# Patient Record
Sex: Male | Born: 1955 | Race: White | Hispanic: No | Marital: Married | Smoking: Never smoker
Health system: Southern US, Community
[De-identification: ages and names within clinical notes are randomized; demographics above are authoritative.]

## PROBLEM LIST (undated history)

## (undated) DIAGNOSIS — C801 Malignant (primary) neoplasm, unspecified: Secondary | ICD-10-CM

## (undated) DIAGNOSIS — G935 Compression of brain: Secondary | ICD-10-CM

---

## 2000-10-28 ENCOUNTER — Ambulatory Visit (HOSPITAL_COMMUNITY): Admission: RE | Admit: 2000-10-28 | Discharge: 2000-10-29 | Payer: Self-pay | Admitting: Neurosurgery

## 2000-10-28 ENCOUNTER — Encounter: Payer: Self-pay | Admitting: Neurosurgery

## 2001-02-09 ENCOUNTER — Encounter: Payer: Self-pay | Admitting: Neurosurgery

## 2001-02-09 ENCOUNTER — Encounter: Admission: RE | Admit: 2001-02-09 | Discharge: 2001-02-09 | Payer: Self-pay | Admitting: Neurosurgery

## 2008-11-22 DIAGNOSIS — C801 Malignant (primary) neoplasm, unspecified: Secondary | ICD-10-CM

## 2008-11-22 HISTORY — DX: Malignant (primary) neoplasm, unspecified: C80.1

## 2009-06-27 ENCOUNTER — Encounter: Admission: RE | Admit: 2009-06-27 | Discharge: 2009-06-27 | Payer: Self-pay | Admitting: Family Medicine

## 2009-09-18 ENCOUNTER — Encounter: Admission: RE | Admit: 2009-09-18 | Discharge: 2009-09-18 | Payer: Self-pay | Admitting: Family Medicine

## 2009-10-03 ENCOUNTER — Ambulatory Visit (HOSPITAL_COMMUNITY): Admission: RE | Admit: 2009-10-03 | Discharge: 2009-10-03 | Payer: Self-pay | Admitting: Neurosurgery

## 2009-10-09 ENCOUNTER — Ambulatory Visit (HOSPITAL_COMMUNITY): Admission: RE | Admit: 2009-10-09 | Discharge: 2009-10-09 | Payer: Self-pay | Admitting: Neurosurgery

## 2009-10-13 ENCOUNTER — Ambulatory Visit: Payer: Self-pay | Admitting: Thoracic Surgery

## 2009-10-17 ENCOUNTER — Ambulatory Visit: Admission: RE | Admit: 2009-10-17 | Discharge: 2009-10-17 | Payer: Self-pay | Admitting: Thoracic Surgery

## 2009-10-20 ENCOUNTER — Ambulatory Visit (HOSPITAL_COMMUNITY): Admission: AD | Admit: 2009-10-20 | Discharge: 2009-10-21 | Payer: Self-pay | Admitting: Thoracic Surgery

## 2009-10-20 ENCOUNTER — Encounter (INDEPENDENT_AMBULATORY_CARE_PROVIDER_SITE_OTHER): Payer: Self-pay | Admitting: Diagnostic Radiology

## 2009-10-21 ENCOUNTER — Ambulatory Visit (HOSPITAL_COMMUNITY): Admission: RE | Admit: 2009-10-21 | Discharge: 2009-10-21 | Payer: Self-pay | Admitting: Radiology

## 2009-10-28 ENCOUNTER — Encounter: Admission: RE | Admit: 2009-10-28 | Discharge: 2009-10-28 | Payer: Self-pay | Admitting: Thoracic Surgery

## 2009-10-28 ENCOUNTER — Ambulatory Visit: Payer: Self-pay | Admitting: Thoracic Surgery

## 2010-05-13 IMAGING — CT CT HEAD WO/W CM
3 series · 18 of 30 positions shown, 20 images · IV contrast (agent unspecified)
Comparison: None

CLINICAL DATA: Left lung mass.  Staging.

CT HEAD WITHOUT AND WITH CONTRAST
TECHNIQUE: Contiguous axial images were obtained from the base of
the skull through the vertex without and with intravenous contrast.
Contrast: 75 ml [id number]

[Series 2: head w/o · axial · non-contrast · 0.49mm/px · z∈[+46,+175]mm · 8 of 32 slices shown, 10 images (1 of 2)]
[im 4/32  brain]
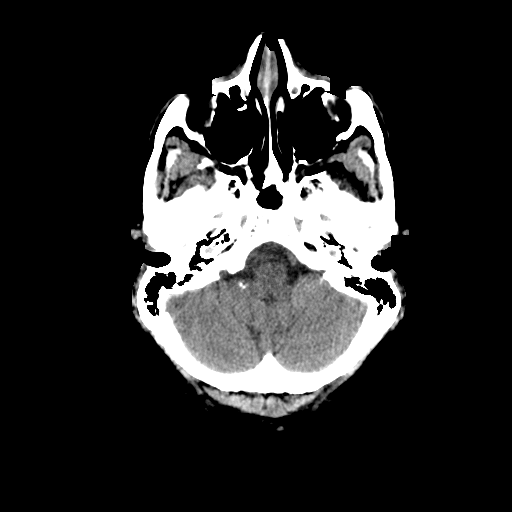
[im 4/32  bone]
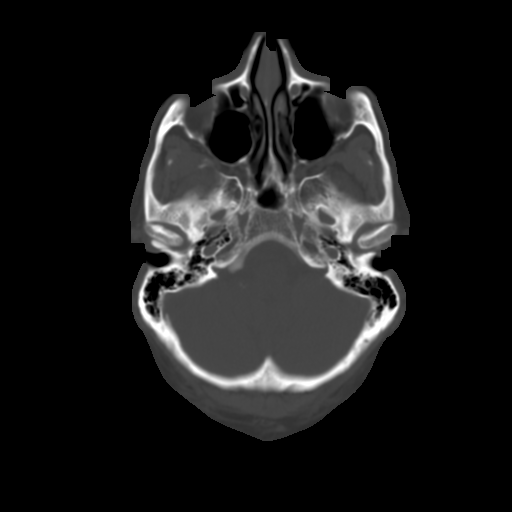
[im 7/32  brain]
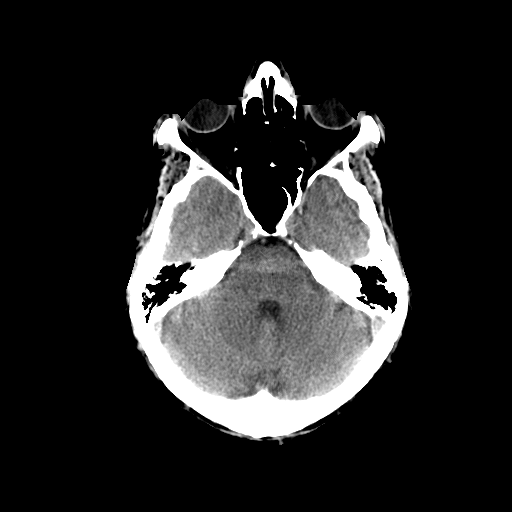
[im 11/32  brain]
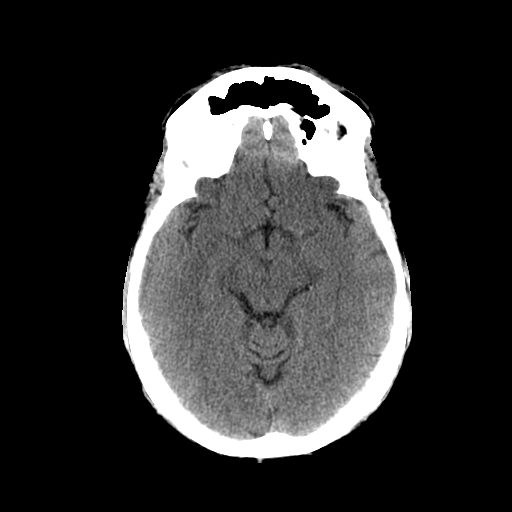
[im 14/32  brain]
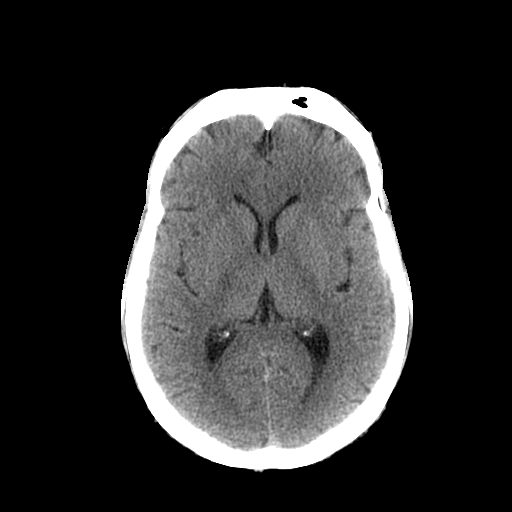
[im 18/32  brain]
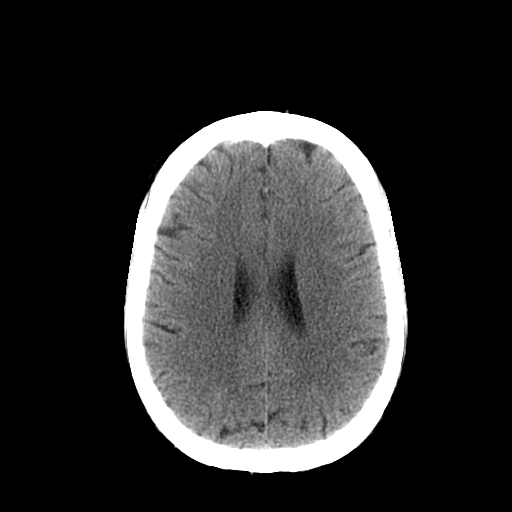
[im 18/32  bone]
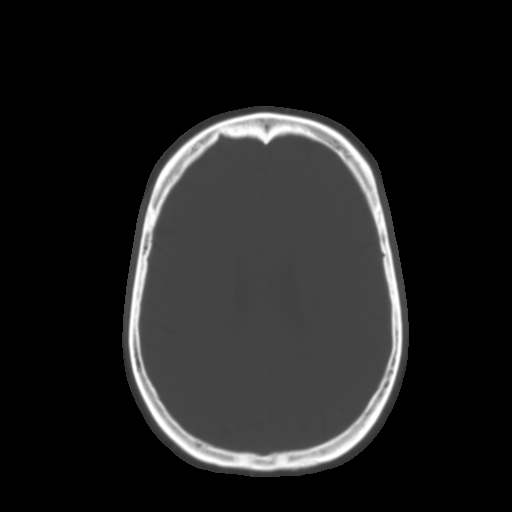
[im 21/32  brain]
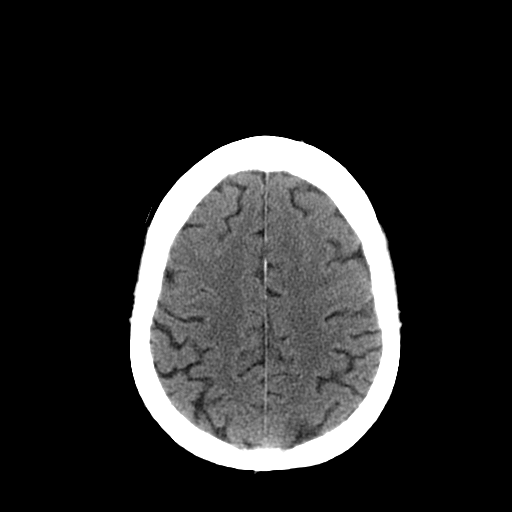
[im 25/32  brain]
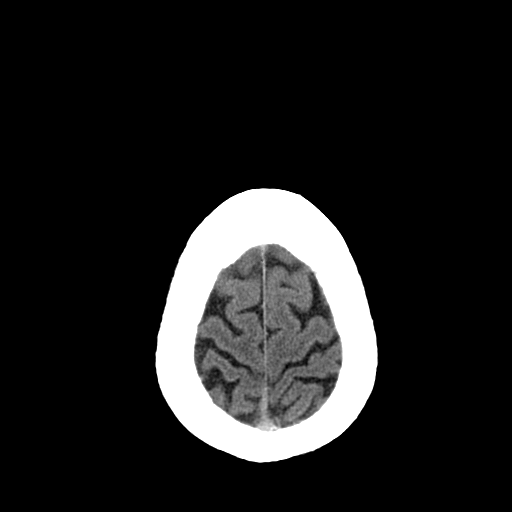
[im 28/32  brain]
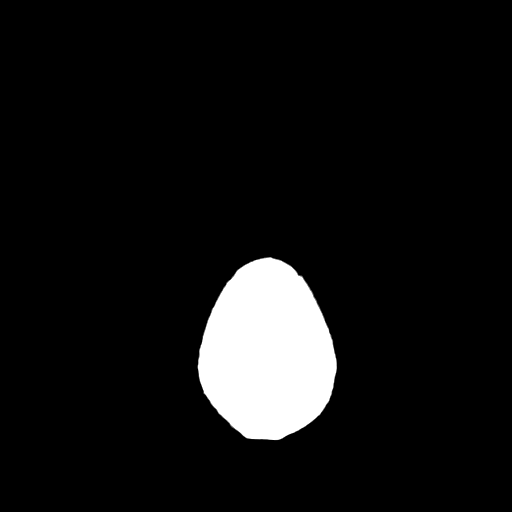

[Series 3: head bone · axial · 0.49mm/px · z∈[+46,+62]mm · 2 of 32 slices shown]
[im 4/32  bone]
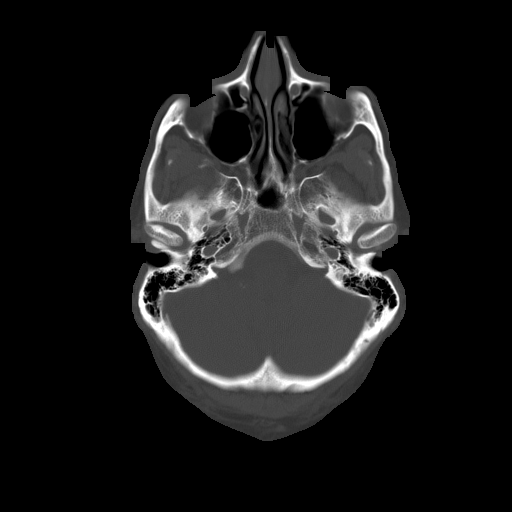
[im 7/32  bone]
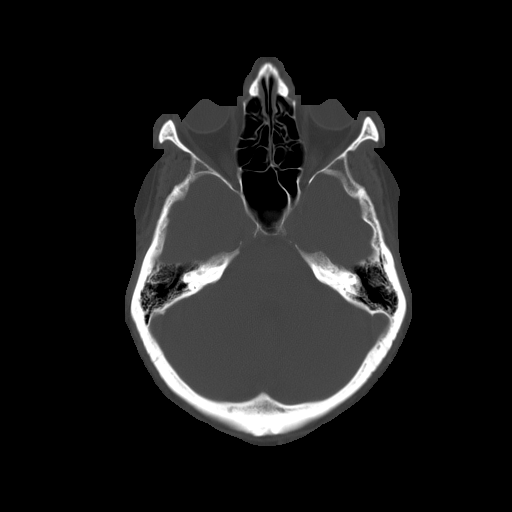

[Series 4: head w/o · axial · non-contrast · 0.49mm/px · z∈[+46,+175]mm · 8 of 32 slices shown (2 of 2)]
[im 4/32  brain]
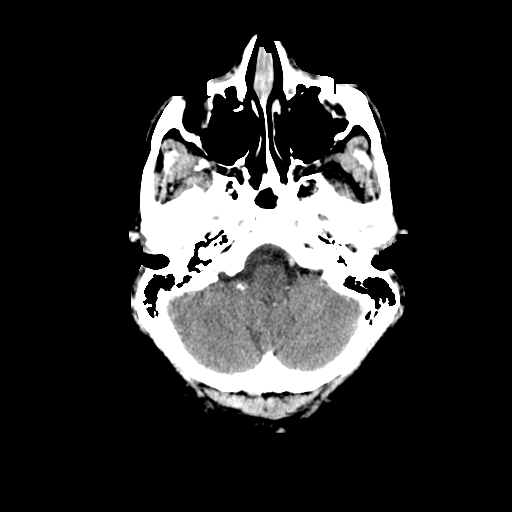
[im 7/32  brain]
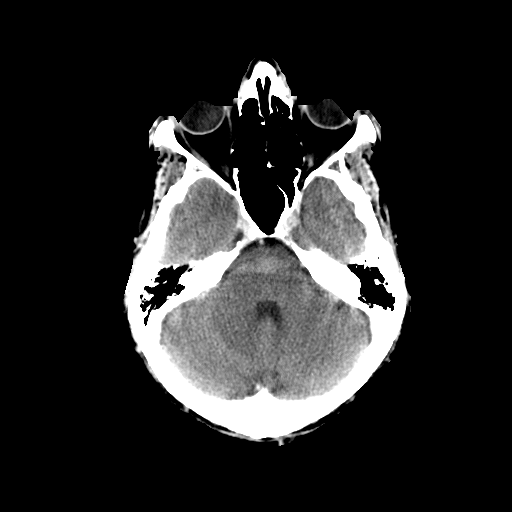
[im 11/32  brain]
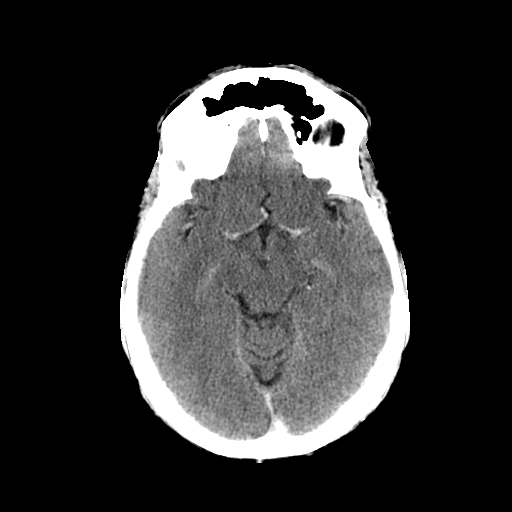
[im 14/32  brain]
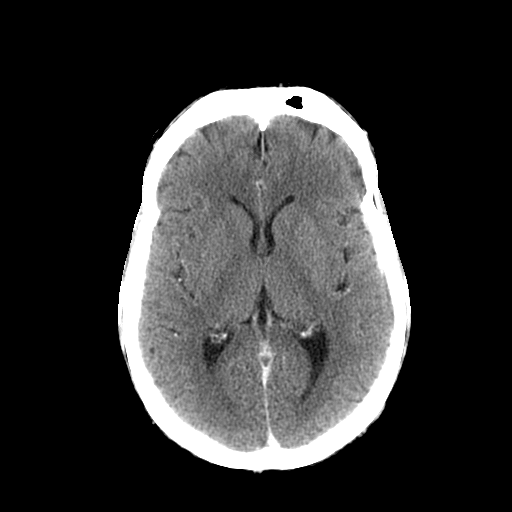
[im 18/32  brain]
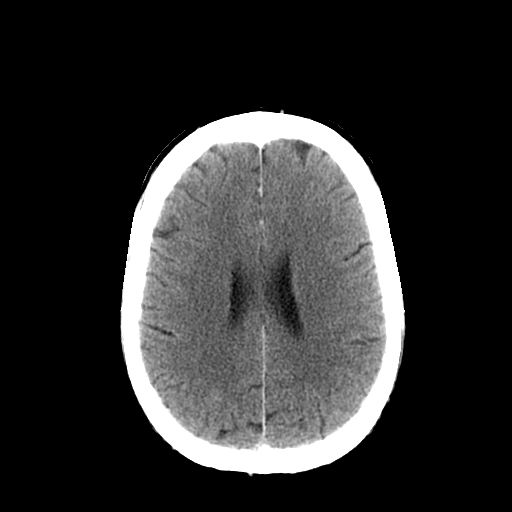
[im 21/32  brain]
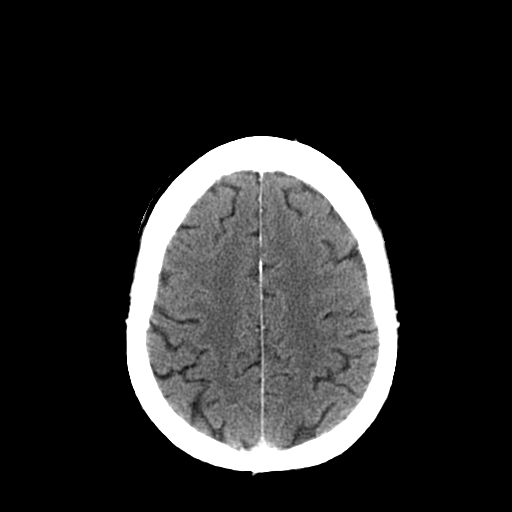
[im 25/32  brain]
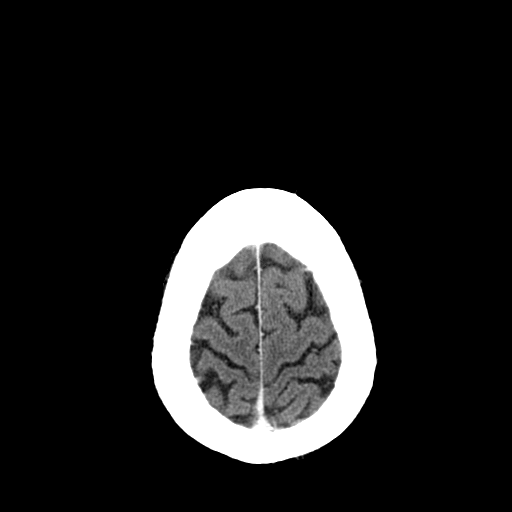
[im 28/32  brain]
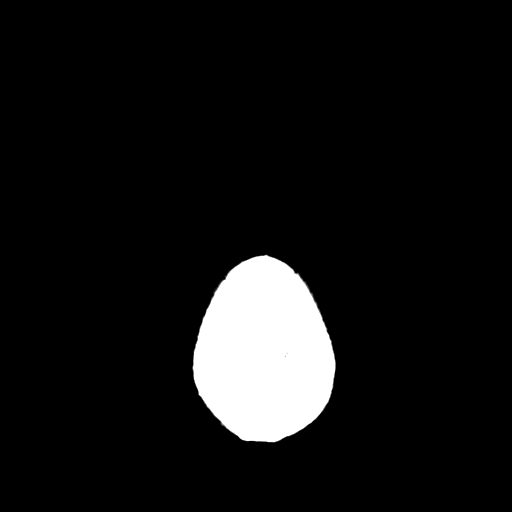

[18 of 30 positions shown; findings below may reference images not displayed]

FINDINGS: The brain has a normal appearance without evidence of
atrophy, old or acute infarction, mass lesion, hemorrhage,
hydrocephalus or extra-axial collection.  No abnormal enhancement
occurs.  The calvarium is unremarkable.  The sinuses, middle ears
and mastoids are clear.
IMPRESSION: Normal examination

## 2010-12-13 ENCOUNTER — Encounter: Payer: Self-pay | Admitting: Family Medicine

## 2011-02-24 LAB — APTT: aPTT: 32 seconds (ref 24–37)

## 2011-02-24 LAB — BODY FLUID CULTURE

## 2011-02-24 LAB — GLUCOSE, CAPILLARY: Glucose-Capillary: 125 mg/dL — ABNORMAL HIGH (ref 70–99)

## 2011-02-24 LAB — CBC
MCHC: 34.7 g/dL (ref 30.0–36.0)
RBC: 4.33 MIL/uL (ref 4.22–5.81)
RDW: 12.6 % (ref 11.5–15.5)

## 2011-02-24 LAB — PROTIME-INR
INR: 1 (ref 0.00–1.49)
Prothrombin Time: 13.1 seconds (ref 11.6–15.2)

## 2011-02-24 LAB — AFB CULTURE WITH SMEAR (NOT AT ARMC)

## 2011-02-24 LAB — FUNGUS CULTURE W SMEAR

## 2011-04-06 NOTE — Letter (Signed)
October 13, 2009   Stacie Acres. Cliffton Asters, MD  [address]  [place], [place] [address]   Re:  Norman Hernandez, NEGRON                  DOB:  1956/09/21   Dear Dr. Cliffton Asters:   I saw the patient today.  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  During the  MRI, they saw a left upper lobe apical lesion.  He gives a history of  having a stress test done at [facility] a year ago and he said he has had  chest x-rays in the past and does not remember here anything about any  left apical lesion.  The lesion is on CT scan is 40 mm x 30 mm.  A PET  scan was done that showed an uptake of 2.4, which was read as possibly a  lung cancer.  He has had no hemoptysis, fever, chills, or excessive  sputum.  No exposure to that he can tell of any recent exposure to TB.   PAST MEDICAL HISTORY:   MEDICATIONS:  He is on no medicines.   ALLERGIES:  He is allergic to penicillin.  He has been told he has  hypercholesterolemia.   FAMILY HISTORY:  Noncontributory.   SOCIAL HISTORY:  He is married, 2 children.  Works as an Copywriter, advertising.  Does not smoke, does not drink alcohol on a regular basis.   REVIEW OF SYSTEMS:  VITAL SIGNS:  He is 175 pounds.  He is 5 feet 10  inches.  GENERAL:  His weight has been stable.  CARDIAC:  No angina or atrial fibrillation.  PULMONARY:  No hemoptysis, productive cough, asthma, or wheezing.  GI:  No nausea, vomiting, constipation, or diarrhea.  GU:  No kidney disease, dysuria, or frequent urination.  VASCULAR:  No claudication, DVT, or TIAs.  NEUROLOGIC:  No dizziness, headaches, blackouts, or seizures.  MUSCULOSKELETAL:  No arthritis, joint pain, or rash.  PSYCHIATRIC:  No depression or nervousness.  EYE/ENT:  No changes in eyesight or hearing.  HEMATOLOGIC:  No problems with bleeding, clotting disorders, or anemia.   PHYSICAL EXAMINATION:   General:  He is a well-developed Caucasian male,  in no acute distress.  Vital Signs:  His blood pressure was 142/82,  pulse 72, sats were 100%, and respirations were 18.  Head, Eyes, Ears,  Nose, and Throat:  Unremarkable.  Neck:  Supple without thyromegaly.  There is no supraclavicular or axillary adenopathy.  Chest:  Clear to  auscultation and percussion.  Heart:  Regular sinus rhythm.  No murmurs.  Abdomen:  Soft.  There is no hepatosplenomegaly.  Extremities:  Pulses  are 2+.  There is no clubbing or edema.  Neurological:  He is oriented  x3.  Sensory and motor intact.  Cranial nerves intact.   This is a very irregular lesion.  Given the uptake on PET, I think we  need to proceed with a pulmonary function test with DLCO and then a  needle biopsy.  If this is a cancer, then we will proceed with  resection.  We still may recommend resection given the size of this  after I see even if the needle biopsy is negative.  I plan to see him  back again after the above.  I appreciate the opportunity of seeing the  patient.   Sincerely,   Ines Bloomer, M.D.  Electronically Signed   DPB/MEDQ  D:  10/13/2009  T:  10/14/2009  Job:  [id number]

## 2011-04-06 NOTE — Letter (Signed)
October 28, 2009   Stacie Acres. Cliffton Asters, MD  [address]  [place], [place] [address]   Re:  Norman Hernandez, ABRAHA                  DOB:  31-Oct-1956   Dear Dr. Cliffton Asters:   I saw the patient back today, and his needle biopsy unfortunately showed  adenocarcinoma which was very worrisome for a nonsmoker, but this is the  type of cancer that you do see in a nonsmoker.  His blood pressure was  136/81, pulse 88, respirations 18, sats were 96%.  Fortunately, his  pulmonary function tests were excellent so that he can withstand a left  upper lobectomy or a left upper lobe trisegmentectomy without a problem.  His brain scan needs to be done but I doubt that will be positive.  He  wants to get a second opinion at [facility] and apparently will go there  quickly, and if he decides to have surgery here, we plan to do it next  week.  As mentioned, he needs a left upper lobectomy or a VATS or left  upper lobe trisegmentectomy.  I appreciate the opportunity of seeing the  patient.   Sincerely,   Ines Bloomer, M.D.  Electronically Signed   DPB/MEDQ  D:  10/28/2009  T:  10/29/2009  Job:  [id number]

## 2011-04-09 NOTE — Op Note (Signed)
[place]  Patient:    NAJIR, ROOP                   MRN: [id number] Proc. Date: 10/28/00 Adm. Date:  [id number] Disc. Date: [id number] Attending:  Coletta Memos                           Operative Report  PREOPERATIVE DIAGNOSIS: Displaced disk, L5-S1, right.  POSTOPERATIVE DIAGNOSIS: Displaced disk, L5-S1, right.  OPERATION/PROCEDURE: Diskectomy, L5-S1, with microdissection.  SURGEON: Kyle L. Franky Macho, M.D.  COMPLICATIONS: None.  INDICATIONS FOR PROCEDURE: Latravis Grine is a 55 year old gentleman who presented to the office with pain in his right lower extremity.  MRI showed a displaced disk at L5-S1 which is more central but did have a right-sided component.  He had bilateral S1 radiculopathy, although the pain on the right side was greater than that on the left.  He decided to go ahead with surgery upon my recommendation, and he was admitted today for that procedure.  DESCRIPTION OF PROCEDURE: Mr. Stansbery was brought to the operating room and intubated and placed under general anesthesia without difficulty.  He was rolled prone onto a Wilson frame and all pressures points were properly padded.  His back was prepped and I infiltrated 1/2% lidocaine with 1:200,000 strength epinephrine into the proposed incision site.  An x-ray was taken to identify the correct level.  I made a skin incision with a #10 blade and took this down through the skin and to the thoracolumbar fascia.  I exposed the lamina of L5 and of S1 in subperiosteal fashion.  I then started to remove the ligamentum flavum that bridged the lamina of L5 and S1.  When that was done and I had gotten underneath the ligament and was able to see the thecal sac I had the operating microscope brought onto the operative field, and using the microscope then I was able to remove the rest of the ligamentum flavum and clearly identified the S1 nerve root and the thecal sac.  That was  retracted medially and I was able to then appreciate the disk herniation.  I opened the disk space with a #15 blade and then started to remove disk material in a progressive fashion using Ebstein curets and pituitary rongeurs.  When that was completed I inspected the nerve root on the right side.  I felt that I had removed enough disk both on the right side and medially to decompress both the right and left vessel and nerve roots.  I irrigated the wound.  I then closed the wound in layered fashion, reapproximating the thoracolumbar fascia with recurrent sutures, subcutaneous tissues with Vicryl sutures, and Steri-Strips used for the dressing.  The patient tolerated the procedure without difficulty. DD:  11/18/00 TD:  11/18/00 Job: [id number]

## 2014-05-23 ENCOUNTER — Emergency Department (HOSPITAL_COMMUNITY)
Admission: EM | Admit: 2014-05-23 | Discharge: 2014-05-23 | Disposition: A | Discharge status: Home / Self Care | Payer: Commercial Managed Care - PPO | Attending: Emergency Medicine | Admitting: Emergency Medicine

## 2014-05-23 ENCOUNTER — Encounter (HOSPITAL_COMMUNITY): Payer: Self-pay | Admitting: Emergency Medicine

## 2014-05-23 ENCOUNTER — Emergency Department (HOSPITAL_COMMUNITY): Payer: Commercial Managed Care - PPO

## 2014-05-23 DIAGNOSIS — Z91040 Latex allergy status: Secondary | ICD-10-CM | POA: Insufficient documentation

## 2014-05-23 DIAGNOSIS — K219 Gastro-esophageal reflux disease without esophagitis: Secondary | ICD-10-CM

## 2014-05-23 DIAGNOSIS — Z79899 Other long term (current) drug therapy: Secondary | ICD-10-CM | POA: Insufficient documentation

## 2014-05-23 DIAGNOSIS — Z85118 Personal history of other malignant neoplasm of bronchus and lung: Secondary | ICD-10-CM | POA: Insufficient documentation

## 2014-05-23 DIAGNOSIS — R630 Anorexia: Secondary | ICD-10-CM | POA: Insufficient documentation

## 2014-05-23 DIAGNOSIS — K224 Dyskinesia of esophagus: Secondary | ICD-10-CM

## 2014-05-23 DIAGNOSIS — R110 Nausea: Secondary | ICD-10-CM | POA: Insufficient documentation

## 2014-05-23 DIAGNOSIS — R1013 Epigastric pain: Secondary | ICD-10-CM | POA: Insufficient documentation

## 2014-05-23 HISTORY — DX: Compression of brain: G93.5

## 2014-05-23 HISTORY — DX: Malignant (primary) neoplasm, unspecified: C80.1

## 2014-05-23 LAB — COMPREHENSIVE METABOLIC PANEL
ALK PHOS: 42 U/L (ref 39–117)
ALT: 17 U/L (ref 0–53)
AST: 17 U/L (ref 0–37)
Albumin: 4.3 g/dL (ref 3.5–5.2)
Anion gap: 13 (ref 5–15)
BILIRUBIN TOTAL: 0.5 mg/dL (ref 0.3–1.2)
BUN: 10 mg/dL (ref 6–23)
CHLORIDE: 103 meq/L (ref 96–112)
CO2: 26 meq/L (ref 19–32)
Calcium: 10.1 mg/dL (ref 8.4–10.5)
Creatinine, Ser: 0.85 mg/dL (ref 0.50–1.35)
GLUCOSE: 107 mg/dL — AB (ref 70–99)
POTASSIUM: 3.9 meq/L (ref 3.7–5.3)
SODIUM: 142 meq/L (ref 137–147)
Total Protein: 7.6 g/dL (ref 6.0–8.3)

## 2014-05-23 LAB — CBC
HEMATOCRIT: 39.6 % (ref 39.0–52.0)
HEMOGLOBIN: 14 g/dL (ref 13.0–17.0)
MCH: 31.3 pg (ref 26.0–34.0)
MCHC: 35.4 g/dL (ref 30.0–36.0)
MCV: 88.4 fL (ref 78.0–100.0)
Platelets: 210 10*3/uL (ref 150–400)
RBC: 4.48 MIL/uL (ref 4.22–5.81)
RDW: 12.2 % (ref 11.5–15.5)
WBC: 5.3 10*3/uL (ref 4.0–10.5)

## 2014-05-23 LAB — I-STAT TROPONIN, ED: Troponin i, poc: 0 ng/mL (ref 0.00–0.08)

## 2014-05-23 LAB — LIPASE, BLOOD: Lipase: 28 U/L (ref 11–59)

## 2014-05-23 MED ORDER — SUCRALFATE 1 GM/10ML PO SUSP: 1.0000 g | Freq: Three times a day (TID) | ORAL | Status: AC

## 2014-05-23 MED ORDER — GI COCKTAIL ~~LOC~~
30.0000 mL | Freq: Once | ORAL | Status: AC
  Administered 2014-05-23: 30 mL via ORAL
  Filled 2014-05-23: qty 30

## 2014-05-23 NOTE — ED Notes (Signed)
Patient reports that the epigastric cramping is a different pain than the pain he feels from the hiatal hernia. Patient denies SOB,but does c/o slight nausea.

## 2014-05-23 NOTE — Discharge Instructions (Signed)
Esophageal Spasm °Esophageal spasm is an uncoordinated contraction of the muscles of the esophagus (the tube which carries food from your mouth to your stomach). Normally, the muscles of the esophagus alternate between contraction and relaxation starting from the top of the esophagus and working down to the bottom. This moves the food from the mouth to the stomach. In esophageal spasm, all the muscles contract at once. This causes pain and fails to move the food along. As a result, you may have trouble swallowing.  °Women are more likely than men to have esophageal spasm. The cause of the spasms is not known. Sometimes eating hot or cold foods triggers the condition and this may be due to an overly sensitive esophagus. This is not an infectious disease and cannot be passed to others. °SYMPTOMS  °Symptoms of esophageal spasm may include: chest pain, burning or pain with swallowing, and difficulty swallowing.  °DIAGNOSIS  °Esophageal spasm can be diagnosed by a test called manometry (pressure studies of the esophagus). In this test, a special tube is inserted down the esophagus. The tube measures the muscle activity of the esophagus. Abnormal contractions mixed with normal movement helps confirm the diagnosis.  °A person with a hypersensitive esophagus may be diagnosed by inflating a long balloon in the person's esophagus. If this causes the same symptoms, preventive methods may work. °PREVENTION  °Avoid hot or cold foods if that seems to be a trigger. °PROGNOSIS  °This condition does not go away, nor is treatment entirely satisfactory. Patients need to be careful of what they eat. They need to continue on medication if a useful one is found. Fortunately, the condition does not get progressively worse as time passes. °Esophageal spasm does not usually lead to more serious problems but sometimes the pain can be disabling. If a person becomes afraid to eat they may become malnourished and lose weight.  °TREATMENT  °· A  procedure in which instruments of increasing size are inserted through the esophagus to enlarge (dilate) it are used. °· Medications that decrease acid-production of the stomach may be used such as proton-pump inhibitors or H2-blockers. °· Medications of several types can be used to relax the muscles of the esophagus. °· An individual with a hypersensitive esophagus sometimes improves with low doses of medications normally used for depression. °· No treatment for esophageal spasm is effective for everyone. Often several approaches will be tried before one works. In many cases, the symptoms will improve, but will not go away completely. °· For severe cases, relief is obtained two-thirds of the time by cutting the muscles along the entire length of the esophagus. This is a major surgical procedure. °· Your symptoms are usually the best guide to how well the treatment for esophageal spasm works. °SIDE EFFECTS OF TREATMENTS °· Nitrates can cause headaches and low blood pressure. °· Calcium channel blockers can cause: °¨ Feeling sick to your stomach (nausea). °¨ Constipation and other side effects. °· Antidepressants can cause side effects that depend on the medication used. °HOME CARE INSTRUCTIONS  °· Let your caregiver know if problems are getting worse, or if you get food stuck in your esophagus for longer than 1 hour or as directed and are unable to swallow liquid. °· Take medications as directed and with permission of your caregiver. Ask about what to do if a medication seems to get stuck in your esophagus. Only take over-the-counter or prescription medicines for pain, discomfort, or fever as directed by your caregiver. °· Soft and liquid foods   pass more easily than solid pieces. SEEK IMMEDIATE MEDICAL CARE IF:   You develop severe chest pain, especially if the pain is crushing or pressure-like and spreads to the arms, back, neck, or jaw, or if you have sweating, nausea, or shortness of breath. THIS COULD BE AN  EMERGENCY. Do not wait to see if the pain will go away. Get medical help at once. Call 911 or [phone] (operator). DO NOT drive yourself to the hospital.  Your chest pain gets worse and does not go away with rest.  You have an attack of chest pain lasting longer than usual despite rest and treatment with the medications your physician has prescribed.  You wake from sleep with chest pain or shortness of breath.  You feel dizzy or faint.  You have chest pain, not typical of your usual pain, caused by your esophagus for which you originally saw your caregiver. MAKE SURE YOU:   Understand these instructions.  Will watch your condition.  Will get help right away if you are not doing well or get worse. Document Released: 01/29/2003 Document Revised: 01/31/2012 Document Reviewed: 08/13/2008 [facility] Patient Information 2015 [place], [place]. This information is not intended to replace advice given to you by your health care provider. Make sure you discuss any questions you have with your health care provider.

## 2014-05-23 NOTE — ED Provider Notes (Addendum)
CSN: [id number]     Arrival date & time 05/23/14  1311 History   First MD Initiated Contact with Patient 05/23/14 1321     Chief Complaint  Patient presents with  . Chest Pain  . Abdominal Pain     (Consider location/radiation/quality/duration/timing/severity/associated sxs/prior Treatment) Patient is a 58 y.o. male presenting with chest pain and abdominal pain. The history is provided by the patient.  Chest Pain Pain location:  Epigastric Pain quality: aching, burning and sharp   Radiates to: substernal chest. Pain radiates to the back: no   Pain severity:  Moderate Onset quality:  Gradual Duration:  6 hours Timing:  Constant Progression:  Waxing and waning Chronicity:  New Context comment:  Had severe nausea and diarrhea 4 days ago which resolved and then woke up this morning with burning and sharp pain in the epigastrium Relieved by:  Antacids (some improvement with tums) Worsened by:  Nothing tried Associated symptoms: abdominal pain, anorexia and nausea   Associated symptoms: no back pain, no cough, no diaphoresis, no fever, no lower extremity edema, no palpitations, no shortness of breath and not vomiting   Risk factors: no coronary artery disease, no diabetes mellitus, no hypertension, no immobilization, no prior DVT/PE and no smoking   Risk factors comment:  No alcohol use Abdominal Pain Associated symptoms: anorexia, chest pain and nausea   Associated symptoms: no cough, no fever, no shortness of breath and no vomiting     Past Medical History  Diagnosis Date  . Cancer 2010    lung  . Hernia cerebri    History reviewed. No pertinent past surgical history. No family history on file. History  Substance Use Topics  . Smoking status: Never Smoker   . Smokeless tobacco: Not on file  . Alcohol Use: No    Review of Systems  Constitutional: Negative for fever and diaphoresis.  Respiratory: Negative for cough and shortness of breath.   Cardiovascular: Positive for  chest pain. Negative for palpitations.  Gastrointestinal: Positive for nausea, abdominal pain and anorexia. Negative for vomiting.  Musculoskeletal: Negative for back pain.  All other systems reviewed and are negative.     Allergies  Nylon and Latex  Home Medications   Prior to Admission medications   Medication Sig Start Date End Date Taking? Authorizing Provider  omeprazole (PRILOSEC) 20 MG capsule Take 20 mg by mouth daily.   Yes Historical Provider, MD  vitamin B-12 (CYANOCOBALAMIN) 100 MCG tablet Take 100 mcg by mouth daily.   Yes Historical Provider, MD   BP 145/81  Pulse 86  Temp(Src) 98 F (36.7 C) (Oral)  Resp 18  SpO2 100% Physical Exam  Nursing note and vitals reviewed. Constitutional: He is oriented to person, place, and time. He appears well-developed and well-nourished. No distress.  HENT:  Head: Normocephalic and atraumatic.  Mouth/Throat: Oropharynx is clear and moist.  Eyes: Conjunctivae and EOM are normal. Pupils are equal, round, and reactive to light.  Neck: Normal range of motion. Neck supple.  Cardiovascular: Normal rate, regular rhythm and intact distal pulses.   No murmur heard. Pulmonary/Chest: Effort normal and breath sounds normal. No respiratory distress. He has no wheezes. He has no rales.  Abdominal: Soft. Normal appearance. He exhibits no distension. There is tenderness in the epigastric area. There is no rebound and no guarding.  Mild epigastric tenderness to palpation  Musculoskeletal: Normal range of motion. He exhibits no edema and no tenderness.  Neurological: He is alert and oriented to person, place, and  time.  Skin: Skin is warm and dry. No rash noted. No erythema.  Psychiatric: He has a normal mood and affect. His behavior is normal.    ED Course  Procedures (including critical care time) Labs Review Labs Reviewed  COMPREHENSIVE METABOLIC PANEL - Abnormal; Notable for the following:    Glucose, Bld 107 (*)    All other  components within normal limits  CBC  LIPASE, BLOOD  I-STAT TROPOININ, ED    Imaging Review Dg Chest Port 1 View  05/23/2014   CLINICAL DATA:  Central chest pain for 6 days, history of lung cancer.  EXAM: PORTABLE CHEST - 1 VIEW  COMPARISON:  Chest radiograph October 28, 2009  FINDINGS: Interval left upper lobectomy, with expected postoperative changes, left apical post surgical staple line. No pleural effusions or focal consolidations. Thorax. Soft tissue planes and included osseous structures are nonsuspicious.  IMPRESSION: No acute cardiopulmonary process.   Electronically Signed   By: Elon Alas   On: 05/23/2014 13:45     EKG Interpretation   Date/Time:  Thursday May 23 2014 13:17:45 EDT Ventricular Rate:  95 PR Interval:  142 QRS Duration: 109 QT Interval:  377 QTC Calculation: 474 R Axis:   87 Text Interpretation:  Sinus rhythm RSR' in V1 or V2, right VCD or RVH  Baseline wander in lead(s) I No significant change since last tracing  Confirmed by [facility]  MD, Loree Fee ([id number]) on 05/23/2014 1:20:27 PM      MDM   Final diagnoses:  Esophageal spasm  Gastroesophageal reflux disease, esophagitis presence not specified    Patient presenting with symptoms today are most suggestive of reflux and esophageal spasm. Pain is in the epigastric region burning in nature and radiates into his chest. He has not had an appetite today but denies any shortness of breath, cough or diaphoresis. He has no cardiac risk factors and low suspicion for PE. He does have a prior history of lung cancer status post lobectomy without complications.  Pt has known hiatal hernia and 2 days ago had nausea and diarrhea that resolved.  Sx some improved with tums today.  Heart score of 1 for age only.   EKG without acute findings.  CXR unchanged. On exam only mild epigastric pain but no significant LUQ pain.   CBC, CMP, lipase and trop pending.  Pt given gi cocktail.  3:25 PM Labs normal.  Pain resolved  after gi cocktail.  Will have pt double ppi and start carafate with gi f/u if not improved.  Blanchie Dessert, MD 05/23/14 [place], MD 05/23/14 1527

## 2014-05-23 NOTE — ED Notes (Signed)
Pt states that he had upset stomach on Monday but is better now. Pt states that last night started having upper abd pain that radiates into mid chest. Pt states nausea but denies vomiting. Pt states that he does have hernia.

## 2014-11-11 ENCOUNTER — Other Ambulatory Visit: Payer: Self-pay | Admitting: Neurosurgery

## 2014-11-11 DIAGNOSIS — M5415 Radiculopathy, thoracolumbar region: Secondary | ICD-10-CM

## 2014-11-16 ENCOUNTER — Ambulatory Visit
Admission: RE | Admit: 2014-11-16 | Discharge: 2014-11-16 | Disposition: A | Discharge status: Home / Self Care | Payer: Commercial Managed Care - PPO | Source: Ambulatory Visit | Attending: Neurosurgery | Admitting: Neurosurgery

## 2014-11-16 DIAGNOSIS — M5415 Radiculopathy, thoracolumbar region: Secondary | ICD-10-CM

## 2021-01-12 ENCOUNTER — Other Ambulatory Visit: Payer: Self-pay | Admitting: Internal Medicine

## 2021-01-12 DIAGNOSIS — M4316 Spondylolisthesis, lumbar region: Secondary | ICD-10-CM

## 2021-01-25 ENCOUNTER — Ambulatory Visit: Admission: RE | Admit: 2021-01-25 | Payer: Commercial Managed Care - PPO | Source: Ambulatory Visit

## 2021-02-19 ENCOUNTER — Other Ambulatory Visit: Payer: Self-pay | Admitting: Physical Medicine and Rehabilitation

## 2021-02-19 DIAGNOSIS — G8929 Other chronic pain: Secondary | ICD-10-CM

## 2021-02-19 DIAGNOSIS — M5442 Lumbago with sciatica, left side: Secondary | ICD-10-CM

## 2021-02-19 DIAGNOSIS — K6289 Other specified diseases of anus and rectum: Secondary | ICD-10-CM

## 2021-03-15 ENCOUNTER — Ambulatory Visit
Admission: RE | Admit: 2021-03-15 | Discharge: 2021-03-15 | Disposition: A | Discharge status: Home / Self Care | Payer: Commercial Managed Care - PPO | Source: Ambulatory Visit | Attending: Physical Medicine and Rehabilitation | Admitting: Physical Medicine and Rehabilitation

## 2021-03-15 ENCOUNTER — Other Ambulatory Visit: Payer: Self-pay

## 2021-03-15 DIAGNOSIS — M5442 Lumbago with sciatica, left side: Secondary | ICD-10-CM

## 2021-03-15 DIAGNOSIS — G8929 Other chronic pain: Secondary | ICD-10-CM

## 2021-03-15 DIAGNOSIS — K6289 Other specified diseases of anus and rectum: Secondary | ICD-10-CM

## 2022-11-30 ENCOUNTER — Other Ambulatory Visit: Payer: Self-pay | Admitting: Family Medicine

## 2022-11-30 DIAGNOSIS — M5416 Radiculopathy, lumbar region: Secondary | ICD-10-CM

## 2022-12-11 ENCOUNTER — Ambulatory Visit
Admission: RE | Admit: 2022-12-11 | Discharge: 2022-12-11 | Disposition: A | Discharge status: Home / Self Care | Payer: Commercial Managed Care - PPO | Source: Ambulatory Visit | Attending: Family Medicine | Admitting: Family Medicine

## 2022-12-11 DIAGNOSIS — M5416 Radiculopathy, lumbar region: Secondary | ICD-10-CM
